# Patient Record
Sex: Male | Born: 1985
Health system: Southern US, Community
[De-identification: ages and names within clinical notes are randomized; demographics above are authoritative.]

## PROBLEM LIST (undated history)

## (undated) HISTORY — PX: APPENDECTOMY: SHX54

---

## 2016-06-27 ENCOUNTER — Encounter (HOSPITAL_BASED_OUTPATIENT_CLINIC_OR_DEPARTMENT_OTHER): Payer: Self-pay | Admitting: *Deleted

## 2016-06-27 ENCOUNTER — Emergency Department (HOSPITAL_BASED_OUTPATIENT_CLINIC_OR_DEPARTMENT_OTHER)
Admission: EM | Admit: 2016-06-27 | Discharge: 2016-06-27 | Disposition: A | Discharge status: Home / Self Care | Payer: 59 | Attending: Emergency Medicine | Admitting: Emergency Medicine

## 2016-06-27 DIAGNOSIS — N342 Other urethritis: Secondary | ICD-10-CM | POA: Insufficient documentation

## 2016-06-27 DIAGNOSIS — F17200 Nicotine dependence, unspecified, uncomplicated: Secondary | ICD-10-CM | POA: Diagnosis not present

## 2016-06-27 DIAGNOSIS — R369 Urethral discharge, unspecified: Secondary | ICD-10-CM | POA: Diagnosis present

## 2016-06-27 LAB — URINALYSIS, ROUTINE W REFLEX MICROSCOPIC
BILIRUBIN URINE: NEGATIVE
Glucose, UA: NEGATIVE mg/dL
Ketones, ur: NEGATIVE mg/dL
NITRITE: NEGATIVE
PH: 6.5 (ref 5.0–8.0)
Protein, ur: 30 mg/dL — AB
Specific Gravity, Urine: 1.022 (ref 1.005–1.030)

## 2016-06-27 LAB — URINALYSIS, MICROSCOPIC (REFLEX)

## 2016-06-27 MED ORDER — CEFTRIAXONE SODIUM 250 MG IJ SOLR
250.0000 mg | Freq: Once | INTRAMUSCULAR | Status: AC
  Administered 2016-06-27: 250 mg via INTRAMUSCULAR
  Filled 2016-06-27: qty 250

## 2016-06-27 MED ORDER — AZITHROMYCIN 250 MG PO TABS
1000.0000 mg | ORAL_TABLET | Freq: Once | ORAL | Status: AC
  Administered 2016-06-27: 1000 mg via ORAL
  Filled 2016-06-27: qty 4

## 2016-06-27 MED ORDER — LIDOCAINE HCL (PF) 1 % IJ SOLN
INTRAMUSCULAR | Status: AC
  Administered 2016-06-27: 0.9 mL
  Filled 2016-06-27: qty 5

## 2016-06-27 NOTE — ED Triage Notes (Signed)
Pt c/o penis discharge and painful urination  X 3 days

## 2016-06-27 NOTE — Discharge Instructions (Signed)
Do not have any unprotected sexual intercourse for 10 days after treatment today. If you have any other positive results. You will be contacted. You may also follow-up with your results on my chart. The instructions are attached to this paperwork.   Get help right away if: Contact your health care provider right away if:  You have severe abdominal pain.  You are a man and notice swelling or pain in your testicles.

## 2016-06-27 NOTE — ED Notes (Signed)
Pt verbalizes understanding of d/c instructions and denies any further needs at this time. 

## 2016-06-27 NOTE — ED Provider Notes (Signed)
[facility]   CSN: [id number] Arrival date & time: 06/27/16  1617   By signing my name below, I, Clarisse GougeXavier Herndon, attest that this documentation has been prepared under the direction and in the presence of Arthor CaptainAbigail Lear Carstens, PA-C. Electronically Signed: Clarisse GougeXavier Herndon, Scribe. 06/27/16. 6:55 PM.   History   Chief Complaint Chief Complaint  Patient presents with  . Penile Discharge   The history is provided by the patient. No language interpreter was used.    HPI Comments: Bryan Blanchard is a [age] y.o. male who presents to the Emergency Department complaining of penile discharge x 3 days. He states that he last had unprotected sex 2 weeks ago. Pt reports associated dysuria and pelvic pain. Pt denies testicle and penis pain.     History reviewed. No pertinent past medical history.  There are no active problems to display for this patient.   History reviewed. No pertinent surgical history.     Home Medications    Prior to Admission medications   Not on File    Family History History reviewed. No pertinent family history.  Social History Social History  Substance Use Topics  . Smoking status: Current Some Day Smoker    Packs/day: 0.50  . Smokeless tobacco: Not on file  . Alcohol use No     Allergies   Patient has no known allergies.   Review of Systems Review of Systems  Genitourinary: Positive for discharge and dysuria. Negative for penile pain and testicular pain.       Pelvic pain  All other systems reviewed and are negative.    Physical Exam Updated Vital Signs BP 118/88 (BP Location: Left Arm)   Pulse 67   Temp 98.2 F (36.8 C)   Resp 16   Ht 5\' 6"  (1.676 m)   Wt 68.9 kg   SpO2 95%   BMI 24.53 kg/m   Physical Exam  Constitutional: He is oriented to person, place, and time. He appears well-developed and well-nourished.  HENT:  Head: Normocephalic.  Eyes: EOM are normal.  Neck: Normal range of motion.    Pulmonary/Chest: Effort normal.  Abdominal: He exhibits no distension.  Genitourinary: Testes normal. Right testis shows no swelling and no tenderness. Left testis shows no swelling and no tenderness. Uncircumcised. No penile tenderness. Discharge found.  Genitourinary Comments: Normal male anatomy. Discharge from the meatus. No testicle pain or swell.  Musculoskeletal: Normal range of motion.  Neurological: He is alert and oriented to person, place, and time.  Psychiatric: He has a normal mood and affect.  Nursing note and vitals reviewed.    ED Treatments / Results  DIAGNOSTIC STUDIES: Oxygen Saturation is 100% on RA, normal by my interpretation.    COORDINATION OF CARE: 7:04 PM Discussed treatment plan with pt at bedside and pt agreed to plan.  Labs (all labs ordered are listed, but only abnormal results are displayed) Labs Reviewed  URINALYSIS, ROUTINE W REFLEX MICROSCOPIC - Abnormal; Notable for the following:       Result Value   APPearance CLOUDY (*)    Hgb urine dipstick TRACE (*)    Protein, ur 30 (*)    Leukocytes, UA LARGE (*)    All other components within normal limits  URINALYSIS, MICROSCOPIC (REFLEX) - Abnormal; Notable for the following:    Bacteria, UA FEW (*)    Squamous Epithelial / LPF 0-5 (*)    All other components within normal limits  RPR  HIV ANTIBODY (ROUTINE TESTING)  GC/CHLAMYDIA PROBE AMP [facility]    EKG  EKG Interpretation None       Radiology No results found.  Procedures Procedures (including critical care time)  Medications Ordered in ED Medications  cefTRIAXone (ROCEPHIN) injection 250 mg (250 mg Intramuscular Given 06/27/16 1911)  azithromycin (ZITHROMAX) tablet 1,000 mg (1,000 mg Oral Given 06/27/16 1910)  lidocaine (PF) (XYLOCAINE) 1 % injection (0.9 mLs  Given 06/27/16 1911)     Initial Impression / Assessment and Plan / ED Course  I have reviewed the triage vital signs and the nursing  notes.  Pertinent labs & imaging results that were available during my care of the patient were reviewed by me and considered in my medical decision making (see chart for details).  Clinical Course     Patient treated in the ED for STI with rocephin and azithromycin. Patient advised to inform and treat all sexual partners.  Pt advised on safe sex practices and understands that they have GC/Chlamydia cultures pending and will result in 2-3 days. HIV and RPR sent. Pt encouraged to follow up at local health department for future STI checks. No concern for prostatitis or epididymitis. Discussed return precautions. Pt appears safe for discharge.    Final Clinical Impressions(s) / ED Diagnoses   Final diagnoses:  Urethritis    New Prescriptions New Prescriptions   No medications on file  I personally performed the services described in this documentation, which was scribed in my presence. The recorded information has been reviewed and is accurate.        Arthor Captainbigail Tyreek Clabo, PA-C 06/27/16 1946    Doug SouSam Jacubowitz, MD 06/28/16 [phone]

## 2016-06-28 LAB — GC/CHLAMYDIA PROBE AMP (~~LOC~~) NOT AT ARMC
Chlamydia: NEGATIVE
Neisseria Gonorrhea: POSITIVE — AB

## 2016-06-28 LAB — RPR: RPR: NONREACTIVE

## 2016-06-28 LAB — HIV ANTIBODY (ROUTINE TESTING W REFLEX): HIV SCREEN 4TH GENERATION: NONREACTIVE

## 2016-10-02 ENCOUNTER — Encounter (HOSPITAL_BASED_OUTPATIENT_CLINIC_OR_DEPARTMENT_OTHER): Payer: Self-pay

## 2016-10-02 ENCOUNTER — Emergency Department (HOSPITAL_BASED_OUTPATIENT_CLINIC_OR_DEPARTMENT_OTHER)
Admission: EM | Admit: 2016-10-02 | Discharge: 2016-10-02 | Disposition: A | Discharge status: Home / Self Care | Payer: PRIVATE HEALTH INSURANCE | Attending: Emergency Medicine | Admitting: Emergency Medicine

## 2016-10-02 DIAGNOSIS — F17200 Nicotine dependence, unspecified, uncomplicated: Secondary | ICD-10-CM | POA: Insufficient documentation

## 2016-10-02 DIAGNOSIS — R369 Urethral discharge, unspecified: Secondary | ICD-10-CM | POA: Diagnosis present

## 2016-10-02 DIAGNOSIS — N342 Other urethritis: Secondary | ICD-10-CM | POA: Insufficient documentation

## 2016-10-02 LAB — URINALYSIS, ROUTINE W REFLEX MICROSCOPIC
Bilirubin Urine: NEGATIVE
GLUCOSE, UA: NEGATIVE mg/dL
HGB URINE DIPSTICK: NEGATIVE
KETONES UR: NEGATIVE mg/dL
Nitrite: NEGATIVE
PROTEIN: NEGATIVE mg/dL
Specific Gravity, Urine: 1.013 (ref 1.005–1.030)
pH: 6 (ref 5.0–8.0)

## 2016-10-02 LAB — URINALYSIS, MICROSCOPIC (REFLEX)
RBC / HPF: NONE SEEN RBC/hpf (ref 0–5)
SQUAMOUS EPITHELIAL / LPF: NONE SEEN

## 2016-10-02 MED ORDER — LIDOCAINE HCL (PF) 1 % IJ SOLN
INTRAMUSCULAR | Status: AC
  Administered 2016-10-02: 1.2 mL
  Filled 2016-10-02: qty 5

## 2016-10-02 MED ORDER — AZITHROMYCIN 1 G PO PACK
1.0000 g | PACK | Freq: Once | ORAL | Status: AC
  Administered 2016-10-02: 1 g via ORAL
  Filled 2016-10-02: qty 1

## 2016-10-02 MED ORDER — CEFTRIAXONE SODIUM 250 MG IJ SOLR
250.0000 mg | Freq: Once | INTRAMUSCULAR | Status: AC
  Administered 2016-10-02: 250 mg via INTRAMUSCULAR
  Filled 2016-10-02: qty 250

## 2016-10-02 NOTE — ED Provider Notes (Signed)
[facility] Note   CSN: [id number] Arrival date & time: 10/02/16  1345   By signing my name below, I, Freida Busman, attest that this documentation has been prepared under the direction and in the presence of Tilden Fossa, MD . Electronically Signed: Freida Busman, Scribe. 10/02/2016. 4:01 PM.   History   Chief Complaint Chief Complaint  Patient presents with  . Penile Discharge    The history is provided by the patient. No language interpreter was used.     HPI Comments:  Bryan Blanchard is a 31 y.o. male who presents to the Emergency Department complaining of dysuria  x a few days with associated penile discharge and urethral itching. He has a h/o gonorrhea ~4 months ago and was treated but states symptoms today are similar. He denies fever, nausea, vomiting, rectal pain, and abdominal pain. No night sweats or weight loss. Pt is sexually active with the same male partner for the last 2 months but has had at least 2 male partners in the last 6 months. He does not use condoms. Pt is otherwise healthy.  History reviewed. No pertinent past medical history.  There are no active problems to display for this patient.   Past Surgical History:  Procedure Laterality Date  . APPENDECTOMY         Home Medications    Prior to Admission medications   Not on File    Family History No family history on file.  Social History Social History  Substance Use Topics  . Smoking status: Current Some Day Smoker    Packs/day: 0.50  . Smokeless tobacco: Never Used  . Alcohol use Yes     Comment: weekends     Allergies   Patient has no known allergies.   Review of Systems Review of Systems  Constitutional: Negative for fever.  Gastrointestinal: Negative for abdominal pain, nausea, rectal pain and vomiting.  Genitourinary: Positive for discharge and dysuria.     Physical Exam Updated Vital Signs BP 133/87 (BP Location: Left Arm)   Pulse 95   Temp  98.8 F (37.1 C) (Oral)   Resp 16   Ht 5\' 6"  (1.676 m)   Wt 152 lb (68.9 kg)   SpO2 99%   BMI 24.53 kg/m   Physical Exam  Constitutional: He is oriented to person, place, and time. He appears well-developed and well-nourished.  HENT:  Head: Normocephalic and atraumatic.  Cardiovascular: Normal rate and regular rhythm.   No murmur heard. Pulmonary/Chest: Effort normal and breath sounds normal. No respiratory distress.  Abdominal: Soft. There is no tenderness. There is no rebound and no guarding.  Genitourinary: Rectum normal and penis normal.  Genitourinary Comments: No external rashes; no discharge.  Chaperone (scribe) was present for exam which was performed with no discomfort or complications.   Musculoskeletal: He exhibits no edema or tenderness.  Neurological: He is alert and oriented to person, place, and time.  Skin: Skin is warm and dry.  Psychiatric: He has a normal mood and affect. His behavior is normal.  Nursing note and vitals reviewed.    ED Treatments / Results  DIAGNOSTIC STUDIES:  Oxygen Saturation is 99% on RA, normal by my interpretation.    COORDINATION OF CARE:  3:58 PM Discussed treatment plan with pt at bedside and pt agreed to plan.  Labs (all labs ordered are listed, but only abnormal results are displayed) Labs Reviewed  URINALYSIS, ROUTINE W REFLEX MICROSCOPIC - Abnormal; Notable for the following:  Result Value   Leukocytes, UA TRACE (*)    All other components within normal limits  URINALYSIS, MICROSCOPIC (REFLEX) - Abnormal; Notable for the following:    Bacteria, UA RARE (*)    All other components within normal limits  RPR  HIV ANTIBODY (ROUTINE TESTING)  GC/CHLAMYDIA PROBE AMP ([place]    EKG  EKG Interpretation None       Radiology No results found.  Procedures Procedures (including critical care time)  Medications Ordered in ED Medications  azithromycin (ZITHROMAX) powder 1 g (not administered)   cefTRIAXone (ROCEPHIN) injection 250 mg (not administered)     Initial Impression / Assessment and Plan / ED Course  I have reviewed the triage vital signs and the nursing notes.  Pertinent labs & imaging results that were available during my care of the patient were reviewed by me and considered in my medical decision making (see chart for details).   patient with history of gonorrhea here with penile discharge similar to prior episode. He is currently sexually active. There is no active discharge on exam. Examination and presentation is not consistent with epididymitis, orchitis, proctitis. Given his symptoms and will treat empirically for recurrent STI, covering for gonorrhea and chlamydia. Counseled patient on safe sex practices. Outpatient follow-up and return precautions discussed.  Final Clinical Impressions(s) / ED Diagnoses   Final diagnoses:  Urethritis    New Prescriptions New Prescriptions   No medications on file   I personally performed the services described in this documentation, which was scribed in my presence. The recorded information has been reviewed and is accurate.    Tilden FossaElizabeth Daveah Varone, MD 10/02/16 [phone]

## 2016-10-02 NOTE — ED Triage Notes (Signed)
Pt presents to the ED today with his partner, pt reports 4 day history of penile discharge, dysuria. Denies fever, testicular pain or scrotal swelling. Denies new partners, but does have unprotected sex.

## 2016-10-03 LAB — RPR: RPR: NONREACTIVE

## 2016-10-03 LAB — HIV ANTIBODY (ROUTINE TESTING W REFLEX): HIV SCREEN 4TH GENERATION: NONREACTIVE

## 2017-01-24 ENCOUNTER — Emergency Department (HOSPITAL_BASED_OUTPATIENT_CLINIC_OR_DEPARTMENT_OTHER)
Admission: EM | Admit: 2017-01-24 | Discharge: 2017-01-24 | Disposition: A | Discharge status: Home / Self Care | Payer: PRIVATE HEALTH INSURANCE | Attending: Emergency Medicine | Admitting: Emergency Medicine

## 2017-01-24 ENCOUNTER — Emergency Department (HOSPITAL_BASED_OUTPATIENT_CLINIC_OR_DEPARTMENT_OTHER): Payer: PRIVATE HEALTH INSURANCE

## 2017-01-24 ENCOUNTER — Encounter (HOSPITAL_BASED_OUTPATIENT_CLINIC_OR_DEPARTMENT_OTHER): Payer: Self-pay

## 2017-01-24 DIAGNOSIS — M25511 Pain in right shoulder: Secondary | ICD-10-CM | POA: Insufficient documentation

## 2017-01-24 DIAGNOSIS — Y9355 Activity, bike riding: Secondary | ICD-10-CM | POA: Diagnosis not present

## 2017-01-24 DIAGNOSIS — Y929 Unspecified place or not applicable: Secondary | ICD-10-CM | POA: Insufficient documentation

## 2017-01-24 DIAGNOSIS — F17200 Nicotine dependence, unspecified, uncomplicated: Secondary | ICD-10-CM | POA: Insufficient documentation

## 2017-01-24 DIAGNOSIS — Y999 Unspecified external cause status: Secondary | ICD-10-CM | POA: Diagnosis not present

## 2017-01-24 MED ORDER — CYCLOBENZAPRINE HCL 10 MG PO TABS: 10.0000 mg | ORAL_TABLET | Freq: Two times a day (BID) | ORAL | 0 refills | Status: AC | PRN

## 2017-01-24 MED FILL — CYCLOBENZAPRINE 10 MG TABLE: 10 | 10 days supply | Qty: 20 | Fill #0
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
## 2017-01-24 NOTE — ED Notes (Signed)
Patient transported to X-ray 
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
## 2017-01-24 NOTE — Discharge Instructions (Signed)
Please use the muscle relaxant to help with your muscle spasm and pain. We did not find evidence of other injuries today. Please follow-up with a primary care physician for further management. If any symptoms change or worsen, please return to the nearest emergency department.
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
## 2018-12-06 ENCOUNTER — Other Ambulatory Visit: Payer: Self-pay

## 2018-12-06 ENCOUNTER — Emergency Department (HOSPITAL_COMMUNITY): Payer: 59

## 2018-12-06 ENCOUNTER — Encounter (HOSPITAL_COMMUNITY): Payer: Self-pay | Admitting: Emergency Medicine

## 2018-12-06 ENCOUNTER — Emergency Department (HOSPITAL_COMMUNITY)
Admission: EM | Admit: 2018-12-06 | Discharge: 2018-12-06 | Disposition: A | Discharge status: Home / Self Care | Payer: 59 | Attending: Emergency Medicine | Admitting: Emergency Medicine

## 2018-12-06 DIAGNOSIS — R059 Cough, unspecified: Secondary | ICD-10-CM

## 2018-12-06 DIAGNOSIS — Z20828 Contact with and (suspected) exposure to other viral communicable diseases: Secondary | ICD-10-CM | POA: Diagnosis not present

## 2018-12-06 DIAGNOSIS — Z79899 Other long term (current) drug therapy: Secondary | ICD-10-CM | POA: Insufficient documentation

## 2018-12-06 DIAGNOSIS — R05 Cough: Secondary | ICD-10-CM | POA: Insufficient documentation

## 2018-12-06 DIAGNOSIS — R0602 Shortness of breath: Secondary | ICD-10-CM | POA: Insufficient documentation

## 2018-12-06 MED ORDER — BENZONATATE 100 MG PO CAPS: 100.0000 mg | ORAL_CAPSULE | Freq: Three times a day (TID) | ORAL | 0 refills | Status: AC

## 2018-12-06 MED ORDER — BENZONATATE 100 MG PO CAPS
200.0000 mg | ORAL_CAPSULE | Freq: Once | ORAL | Status: AC
  Administered 2018-12-06: 09:00:00 200 mg via ORAL
  Filled 2018-12-06: qty 2

## 2018-12-06 NOTE — ED Triage Notes (Signed)
Pt c/o cough and SOB for couple days with chest discomfort.

## 2018-12-06 NOTE — Discharge Instructions (Signed)
Please follow the following instructions for 7 days from your symptom onset or until you are free of fever for 72 hours. Follow up with your covid results on mychart.  Stay home except to get medical care You should restrict activities outside your home, except for getting medical care. Do not go to work, school, or public areas, and do not use public transportation or taxis.  Call ahead before visiting your doctor Before your medical appointment, call the healthcare provider and tell them that you have viral illness symptoms  Monitor your symptoms Seek prompt medical attention if your illness is worsening (e.g., difficulty breathing).   Wear a facemask You should wear a facemask that covers your nose and mouth when you are in the same room with other people and when you visit a healthcare provider. People who live with or visit you should also wear a facemask while they are in the same room with you.  Separate yourself from other people in your home As much as possible, you should stay in a different room from other people in your home. Also, you should use a separate bathroom, if available.  Avoid sharing household items You should not share dishes, drinking glasses, cups, eating utensils, towels, bedding, or other items with other people in your home. After using these items, you should wash them thoroughly with soap and water.  Cover your coughs and sneezes Cover your mouth and nose with a tissue when you cough or sneeze, or you can cough or sneeze into your sleeve. Throw used tissues in a lined trash can, and immediately wash your hands with soap and water for at least 20 seconds or use an alcohol-based hand rub.  Wash your [employer] your hands often and thoroughly with soap and water for at least 20 seconds. You can use an alcohol-based hand sanitizer if soap and water are not available and if your hands are not visibly dirty. Avoid touching your eyes, nose, and mouth with unwashed  hands.

## 2018-12-06 NOTE — ED Provider Notes (Signed)
[facility]-EMERGENCY DEPT Provider Note   CSN: [id number] Arrival date & time: 12/06/18  0906    History   Chief Complaint Chief Complaint  Patient presents with   Cough   Shortness of Breath   Chest Pain    HPI Bryan Blanchard is a 33 y.o. male.     Patient is a 33 year old male with no past medical history presenting to the emergency department for cough and shortness of breath.  Patient reports that he has had a cough and nasal congestion for the past 3 days.  Reports that he has had 2 episodes of coughing spells which lead to shortness of breath.  Reports that this morning he was coughing so much that he felt like he could not breathe.  This was resolved when he took a warm shower.  He denies any orthopnea, shortness of breath with exertion, chest pain, leg swelling, fever, chills, sick contacts.  Reports that he lives at home alone with his dogs and has been self quarantining other than to go outside to get food from restaurants.  He has not taken any medication for relief.     History reviewed. No pertinent past medical history.  There are no active problems to display for this patient.   Past Surgical History:  Procedure Laterality Date   APPENDECTOMY          Home Medications    Prior to Admission medications   Medication Sig Start Date End Date Taking? Authorizing Provider  benzonatate (TESSALON) 100 MG capsule Take 1 capsule (100 mg total) by mouth every 8 (eight) hours. 12/06/18   Arlyn Dunning, PA-C  cyclobenzaprine (FLEXERIL) 10 MG tablet Take 1 tablet (10 mg total) by mouth 2 (two) times daily as needed for muscle spasms. 01/24/17   Tegeler, Canary Brim, MD    Family History No family history on file.  Social History Social History   Tobacco Use   Smoking status: Current Some Day Smoker    Packs/day: 0.50    Types: Cigarettes   Smokeless tobacco: Never Used  Substance Use Topics   Alcohol use: Yes    Comment:  weekends   Drug use: No     Allergies   Patient has no known allergies.   Review of Systems Review of Systems  Constitutional: Negative.   HENT: Positive for congestion. Negative for rhinorrhea, sinus pressure, sinus pain and sore throat.   Eyes: Negative for itching.  Respiratory: Positive for cough and shortness of breath. Negative for chest tightness.   Cardiovascular: Negative for chest pain, palpitations and leg swelling.  Gastrointestinal: Negative for abdominal pain, nausea and vomiting.  Genitourinary: Negative for dysuria.  Musculoskeletal: Negative for back pain and myalgias.  Skin: Negative for rash and wound.  Allergic/Immunologic: Negative for immunocompromised state.  Neurological: Negative for dizziness, light-headedness and headaches.     Physical Exam Updated Vital Signs BP (!) 134/97 (BP Location: Left Arm)    Pulse 80    Temp 98.2 F (36.8 C) (Oral)    Resp (!) 25    SpO2 97%   Physical Exam Vitals signs and nursing note reviewed.  Constitutional:      General: He is not in acute distress.    Appearance: He is well-developed. He is not ill-appearing, toxic-appearing or diaphoretic.  HENT:     Head: Normocephalic and atraumatic.     Mouth/Throat:     Mouth: Mucous membranes are moist.     Pharynx: Oropharynx is clear.  Eyes:     Pupils: Pupils are equal, round, and reactive to light.  Cardiovascular:     Rate and Rhythm: Normal rate and regular rhythm.  Pulmonary:     Effort: Pulmonary effort is normal. No tachypnea, bradypnea, accessory muscle usage or respiratory distress.     Breath sounds: Normal breath sounds. No decreased breath sounds, wheezing, rhonchi or rales.  Skin:    General: Skin is warm.  Neurological:     General: No focal deficit present.     Mental Status: He is alert.  Psychiatric:        Mood and Affect: Mood normal.      ED Treatments / Results  Labs (all labs ordered are listed, but only abnormal results are  displayed) Labs Reviewed  NOVEL CORONAVIRUS, NAA (HOSPITAL ORDER, SEND-OUT TO REF LAB)    EKG EKG Interpretation  Date/Time:  Friday Dec 06 2018 09:18:52 EDT Ventricular Rate:  87 PR Interval:    QRS Duration: 84 QT Interval:  352 QTC Calculation: 424 R Axis:   23 Text Interpretation:  Sinus rhythm LAE, consider biatrial enlargement ST elev, probable normal early repol pattern Confirmed by Kristine Royal [phone]) on 12/06/2018 9:24:05 AM   Radiology Dg Chest Portable 1 View  Result Date: 12/06/2018 CLINICAL DATA:  Cough with shortness of breath for 2-3 days. EXAM: PORTABLE CHEST 1 VIEW COMPARISON:  None. FINDINGS: 0918 hours. The heart size and mediastinal contours are normal. The lungs are clear. There is no pleural effusion or pneumothorax. No acute osseous findings are identified. Telemetry leads overlie the chest. IMPRESSION: No active cardiopulmonary process. Electronically Signed   By: Carey Bullocks M.D.   On: 12/06/2018 09:53    Procedures Procedures (including critical care time)  Medications Ordered in ED Medications  benzonatate (TESSALON) capsule 200 mg (200 mg Oral Given 12/06/18 0926)     Initial Impression / Assessment and Plan / ED Course  I have reviewed the triage vital signs and the nursing notes.  Pertinent labs & imaging results that were available during my care of the patient were reviewed by me and considered in my medical decision making (see chart for details).  Clinical Course as of Dec 06 1002  Fri Dec 06, 2018  6045 Bryan Blanchard was evaluated in Emergency Department on 12/06/2018 for the symptoms described in the history of present illness. He was evaluated in the context of the global COVID-19 pandemic, which necessitated consideration that the patient might be at risk for infection with the SARS-CoV-2 virus that causes COVID-19. Institutional protocols and algorithms that pertain to the evaluation of patients at risk for COVID-19 are in a state  of rapid change based on information released by regulatory bodies including the CDC and federal and state organizations. These policies and algorithms were followed during the patient's care in the ED.  Patient stable without hypoxia, tachycardia, significant tachypnea. Chest xray clear. Will d/c with tessalon pearls for presumed URI. Covid test pending. Advised patient on strict return precautions as well as home quarantine and f/u test results.    [KM]    Clinical Course User Index [KM] Arlyn Dunning, PA-C       Based on review of vitals, medical screening exam, lab work and/or imaging, there does not appear to be an acute, emergent etiology for the patient's symptoms. Counseled pt on good return precautions and encouraged both PCP and ED follow-up as needed.  Prior to discharge, I also discussed incidental imaging findings with patient in  detail and advised appropriate, recommended follow-up in detail.  Clinical Impression: 1. Cough     Disposition: Discharge  Prior to providing a prescription for a controlled substance, I independently reviewed the patient's recent prescription history on the [place] Cont[place] Substance Reporting System. The patient had no recent or regular prescriptions and was deemed appropriate for a brief, less than 3 day prescription of narcotic for acute analgesia.  This note was prepared with assistance of Conservation officer, historic buildings[employer]agon voice recognition software. Occasional wrong-word or sound-a-like substitutions may have occurred due to the inherent limitations of voice recognition software.   Final Clinical Impressions(s) / ED Diagnoses   Final diagnoses:  Cough    ED Discharge Orders         Ordered    benzonatate (TESSALON) 100 MG capsule  Every 8 hours     12/06/18 1003           Jeral PinchMcLean, Davarius Ridener A, PA-C 12/06/18 1004    Wynetta FinesMessick, Peter C, MD 12/06/18 1044

## 2018-12-07 LAB — NOVEL CORONAVIRUS, NAA (HOSP ORDER, SEND-OUT TO REF LAB; TAT 18-24 HRS): SARS-CoV-2, NAA: NOT DETECTED
